# Patient Record
Sex: Male | Born: 1965 | Race: White | Hispanic: No | Marital: Married | Smoking: Never smoker
Health system: Southern US, Community
[De-identification: ages and names within clinical notes are randomized; demographics above are authoritative.]

## PROBLEM LIST (undated history)

## (undated) ENCOUNTER — Emergency Department (HOSPITAL_COMMUNITY): Source: Home / Self Care

## (undated) DIAGNOSIS — M549 Dorsalgia, unspecified: Secondary | ICD-10-CM

## (undated) DIAGNOSIS — I10 Essential (primary) hypertension: Secondary | ICD-10-CM

## (undated) HISTORY — PX: TONSILLECTOMY: SUR1361

## (undated) HISTORY — PX: BACK SURGERY: SHX140

---

## 1999-07-26 ENCOUNTER — Encounter: Payer: Self-pay | Admitting: Family Medicine

## 1999-07-26 ENCOUNTER — Ambulatory Visit (HOSPITAL_COMMUNITY): Admission: RE | Admit: 1999-07-26 | Discharge: 1999-07-26 | Payer: Self-pay | Admitting: Family Medicine

## 2003-11-30 ENCOUNTER — Encounter: Admission: RE | Admit: 2003-11-30 | Discharge: 2004-02-28 | Payer: Self-pay | Admitting: Neurosurgery

## 2004-05-29 ENCOUNTER — Emergency Department (HOSPITAL_COMMUNITY): Admission: EM | Admit: 2004-05-29 | Discharge: 2004-05-29 | Payer: Self-pay | Admitting: *Deleted

## 2007-04-02 ENCOUNTER — Encounter: Admission: RE | Admit: 2007-04-02 | Discharge: 2007-04-02 | Payer: Self-pay | Admitting: Specialist

## 2012-12-17 DIAGNOSIS — IMO0002 Reserved for concepts with insufficient information to code with codable children: Secondary | ICD-10-CM | POA: Diagnosis not present

## 2012-12-17 DIAGNOSIS — E669 Obesity, unspecified: Secondary | ICD-10-CM | POA: Diagnosis not present

## 2012-12-19 DIAGNOSIS — M5137 Other intervertebral disc degeneration, lumbosacral region: Secondary | ICD-10-CM | POA: Diagnosis not present

## 2012-12-19 DIAGNOSIS — M5126 Other intervertebral disc displacement, lumbar region: Secondary | ICD-10-CM | POA: Diagnosis not present

## 2013-04-03 DIAGNOSIS — Z23 Encounter for immunization: Secondary | ICD-10-CM | POA: Diagnosis not present

## 2013-04-03 DIAGNOSIS — IMO0002 Reserved for concepts with insufficient information to code with codable children: Secondary | ICD-10-CM | POA: Diagnosis not present

## 2013-04-03 DIAGNOSIS — Z1331 Encounter for screening for depression: Secondary | ICD-10-CM | POA: Diagnosis not present

## 2013-04-03 DIAGNOSIS — M545 Low back pain, unspecified: Secondary | ICD-10-CM | POA: Diagnosis not present

## 2013-04-03 DIAGNOSIS — K2100 Gastro-esophageal reflux disease with esophagitis, without bleeding: Secondary | ICD-10-CM | POA: Diagnosis not present

## 2013-04-03 DIAGNOSIS — E782 Mixed hyperlipidemia: Secondary | ICD-10-CM | POA: Diagnosis not present

## 2013-04-03 DIAGNOSIS — E669 Obesity, unspecified: Secondary | ICD-10-CM | POA: Diagnosis not present

## 2013-04-03 DIAGNOSIS — I10 Essential (primary) hypertension: Secondary | ICD-10-CM | POA: Diagnosis not present

## 2013-06-12 DIAGNOSIS — E782 Mixed hyperlipidemia: Secondary | ICD-10-CM | POA: Diagnosis not present

## 2013-06-12 DIAGNOSIS — I10 Essential (primary) hypertension: Secondary | ICD-10-CM | POA: Diagnosis not present

## 2013-06-12 DIAGNOSIS — E669 Obesity, unspecified: Secondary | ICD-10-CM | POA: Diagnosis not present

## 2013-06-12 DIAGNOSIS — IMO0002 Reserved for concepts with insufficient information to code with codable children: Secondary | ICD-10-CM | POA: Diagnosis not present

## 2013-06-12 DIAGNOSIS — E8881 Metabolic syndrome: Secondary | ICD-10-CM | POA: Diagnosis not present

## 2013-06-12 DIAGNOSIS — K2100 Gastro-esophageal reflux disease with esophagitis, without bleeding: Secondary | ICD-10-CM | POA: Diagnosis not present

## 2013-06-12 DIAGNOSIS — K4090 Unilateral inguinal hernia, without obstruction or gangrene, not specified as recurrent: Secondary | ICD-10-CM | POA: Diagnosis not present

## 2013-06-12 DIAGNOSIS — M545 Low back pain, unspecified: Secondary | ICD-10-CM | POA: Diagnosis not present

## 2013-06-25 DIAGNOSIS — K4030 Unilateral inguinal hernia, with obstruction, without gangrene, not specified as recurrent: Secondary | ICD-10-CM | POA: Diagnosis not present

## 2013-06-26 DIAGNOSIS — D176 Benign lipomatous neoplasm of spermatic cord: Secondary | ICD-10-CM | POA: Diagnosis not present

## 2013-06-26 DIAGNOSIS — I10 Essential (primary) hypertension: Secondary | ICD-10-CM | POA: Diagnosis not present

## 2013-06-26 DIAGNOSIS — F411 Generalized anxiety disorder: Secondary | ICD-10-CM | POA: Diagnosis not present

## 2013-06-26 DIAGNOSIS — K4090 Unilateral inguinal hernia, without obstruction or gangrene, not specified as recurrent: Secondary | ICD-10-CM | POA: Diagnosis not present

## 2013-06-26 DIAGNOSIS — E669 Obesity, unspecified: Secondary | ICD-10-CM | POA: Diagnosis not present

## 2013-06-26 DIAGNOSIS — M129 Arthropathy, unspecified: Secondary | ICD-10-CM | POA: Diagnosis not present

## 2013-06-26 DIAGNOSIS — Z6834 Body mass index (BMI) 34.0-34.9, adult: Secondary | ICD-10-CM | POA: Diagnosis not present

## 2013-06-29 DIAGNOSIS — D176 Benign lipomatous neoplasm of spermatic cord: Secondary | ICD-10-CM | POA: Diagnosis not present

## 2013-06-29 DIAGNOSIS — Z6834 Body mass index (BMI) 34.0-34.9, adult: Secondary | ICD-10-CM | POA: Diagnosis not present

## 2013-06-29 DIAGNOSIS — E669 Obesity, unspecified: Secondary | ICD-10-CM | POA: Diagnosis not present

## 2013-06-29 DIAGNOSIS — F411 Generalized anxiety disorder: Secondary | ICD-10-CM | POA: Diagnosis not present

## 2013-06-29 DIAGNOSIS — I10 Essential (primary) hypertension: Secondary | ICD-10-CM | POA: Diagnosis not present

## 2013-06-29 DIAGNOSIS — K4090 Unilateral inguinal hernia, without obstruction or gangrene, not specified as recurrent: Secondary | ICD-10-CM | POA: Diagnosis not present

## 2013-06-29 DIAGNOSIS — M129 Arthropathy, unspecified: Secondary | ICD-10-CM | POA: Diagnosis not present

## 2013-08-03 DIAGNOSIS — Z79899 Other long term (current) drug therapy: Secondary | ICD-10-CM | POA: Diagnosis not present

## 2013-08-03 DIAGNOSIS — Z5181 Encounter for therapeutic drug level monitoring: Secondary | ICD-10-CM | POA: Diagnosis not present

## 2013-08-03 DIAGNOSIS — K2100 Gastro-esophageal reflux disease with esophagitis, without bleeding: Secondary | ICD-10-CM | POA: Diagnosis not present

## 2013-08-03 DIAGNOSIS — Z1331 Encounter for screening for depression: Secondary | ICD-10-CM | POA: Diagnosis not present

## 2013-08-03 DIAGNOSIS — E782 Mixed hyperlipidemia: Secondary | ICD-10-CM | POA: Diagnosis not present

## 2013-08-03 DIAGNOSIS — I10 Essential (primary) hypertension: Secondary | ICD-10-CM | POA: Diagnosis not present

## 2013-08-03 DIAGNOSIS — E669 Obesity, unspecified: Secondary | ICD-10-CM | POA: Diagnosis not present

## 2013-08-03 DIAGNOSIS — E8881 Metabolic syndrome: Secondary | ICD-10-CM | POA: Diagnosis not present

## 2013-08-03 DIAGNOSIS — K4090 Unilateral inguinal hernia, without obstruction or gangrene, not specified as recurrent: Secondary | ICD-10-CM | POA: Diagnosis not present

## 2013-08-03 DIAGNOSIS — M545 Low back pain, unspecified: Secondary | ICD-10-CM | POA: Diagnosis not present

## 2013-08-03 DIAGNOSIS — IMO0002 Reserved for concepts with insufficient information to code with codable children: Secondary | ICD-10-CM | POA: Diagnosis not present

## 2013-10-30 DIAGNOSIS — I10 Essential (primary) hypertension: Secondary | ICD-10-CM | POA: Diagnosis not present

## 2013-10-30 DIAGNOSIS — G8929 Other chronic pain: Secondary | ICD-10-CM | POA: Diagnosis not present

## 2013-10-30 DIAGNOSIS — F3289 Other specified depressive episodes: Secondary | ICD-10-CM | POA: Diagnosis not present

## 2013-10-30 DIAGNOSIS — E785 Hyperlipidemia, unspecified: Secondary | ICD-10-CM | POA: Diagnosis not present

## 2013-11-25 DIAGNOSIS — Z6830 Body mass index (BMI) 30.0-30.9, adult: Secondary | ICD-10-CM | POA: Diagnosis not present

## 2013-11-25 DIAGNOSIS — I10 Essential (primary) hypertension: Secondary | ICD-10-CM | POA: Diagnosis not present

## 2013-11-25 DIAGNOSIS — M545 Low back pain, unspecified: Secondary | ICD-10-CM | POA: Diagnosis not present

## 2013-11-25 DIAGNOSIS — IMO0002 Reserved for concepts with insufficient information to code with codable children: Secondary | ICD-10-CM | POA: Diagnosis not present

## 2014-03-08 DIAGNOSIS — R079 Chest pain, unspecified: Secondary | ICD-10-CM | POA: Diagnosis not present

## 2014-03-08 DIAGNOSIS — R0789 Other chest pain: Secondary | ICD-10-CM | POA: Diagnosis not present

## 2014-03-09 DIAGNOSIS — I517 Cardiomegaly: Secondary | ICD-10-CM | POA: Diagnosis not present

## 2014-03-09 DIAGNOSIS — R079 Chest pain, unspecified: Secondary | ICD-10-CM | POA: Diagnosis not present

## 2014-03-23 DIAGNOSIS — I132 Hypertensive heart and chronic kidney disease with heart failure and with stage 5 chronic kidney disease, or end stage renal disease: Secondary | ICD-10-CM | POA: Diagnosis not present

## 2014-03-23 DIAGNOSIS — N19 Unspecified kidney failure: Secondary | ICD-10-CM | POA: Diagnosis not present

## 2014-03-23 DIAGNOSIS — R079 Chest pain, unspecified: Secondary | ICD-10-CM | POA: Diagnosis not present

## 2014-03-23 DIAGNOSIS — I509 Heart failure, unspecified: Secondary | ICD-10-CM | POA: Diagnosis not present

## 2014-11-29 DIAGNOSIS — Z79899 Other long term (current) drug therapy: Secondary | ICD-10-CM | POA: Diagnosis not present

## 2014-11-29 DIAGNOSIS — R51 Headache: Secondary | ICD-10-CM | POA: Diagnosis not present

## 2014-11-29 DIAGNOSIS — S300XXA Contusion of lower back and pelvis, initial encounter: Secondary | ICD-10-CM | POA: Diagnosis not present

## 2014-11-29 DIAGNOSIS — M5136 Other intervertebral disc degeneration, lumbar region: Secondary | ICD-10-CM | POA: Diagnosis not present

## 2014-11-29 DIAGNOSIS — I10 Essential (primary) hypertension: Secondary | ICD-10-CM | POA: Diagnosis not present

## 2014-11-29 DIAGNOSIS — M542 Cervicalgia: Secondary | ICD-10-CM | POA: Diagnosis not present

## 2014-11-29 DIAGNOSIS — S39012A Strain of muscle, fascia and tendon of lower back, initial encounter: Secondary | ICD-10-CM | POA: Diagnosis not present

## 2014-11-29 DIAGNOSIS — W28XXXA Contact with powered lawn mower, initial encounter: Secondary | ICD-10-CM | POA: Diagnosis not present

## 2014-11-29 DIAGNOSIS — Z7982 Long term (current) use of aspirin: Secondary | ICD-10-CM | POA: Diagnosis not present

## 2014-11-29 DIAGNOSIS — S1083XA Contusion of other specified part of neck, initial encounter: Secondary | ICD-10-CM | POA: Diagnosis not present

## 2014-11-29 DIAGNOSIS — Z8249 Family history of ischemic heart disease and other diseases of the circulatory system: Secondary | ICD-10-CM | POA: Diagnosis not present

## 2015-01-19 DIAGNOSIS — I10 Essential (primary) hypertension: Secondary | ICD-10-CM | POA: Diagnosis not present

## 2015-01-19 DIAGNOSIS — I429 Cardiomyopathy, unspecified: Secondary | ICD-10-CM | POA: Diagnosis not present

## 2015-01-19 DIAGNOSIS — E785 Hyperlipidemia, unspecified: Secondary | ICD-10-CM | POA: Diagnosis not present

## 2016-05-31 DIAGNOSIS — L739 Follicular disorder, unspecified: Secondary | ICD-10-CM | POA: Diagnosis not present

## 2016-05-31 DIAGNOSIS — M5136 Other intervertebral disc degeneration, lumbar region: Secondary | ICD-10-CM | POA: Diagnosis not present

## 2016-05-31 DIAGNOSIS — I10 Essential (primary) hypertension: Secondary | ICD-10-CM | POA: Diagnosis not present

## 2016-05-31 DIAGNOSIS — F418 Other specified anxiety disorders: Secondary | ICD-10-CM | POA: Diagnosis not present

## 2016-05-31 DIAGNOSIS — F5102 Adjustment insomnia: Secondary | ICD-10-CM | POA: Diagnosis not present

## 2016-05-31 DIAGNOSIS — Z6827 Body mass index (BMI) 27.0-27.9, adult: Secondary | ICD-10-CM | POA: Diagnosis not present

## 2016-08-16 DIAGNOSIS — M5136 Other intervertebral disc degeneration, lumbar region: Secondary | ICD-10-CM | POA: Diagnosis not present

## 2016-08-16 DIAGNOSIS — Z6826 Body mass index (BMI) 26.0-26.9, adult: Secondary | ICD-10-CM | POA: Diagnosis not present

## 2016-08-16 DIAGNOSIS — F5102 Adjustment insomnia: Secondary | ICD-10-CM | POA: Diagnosis not present

## 2016-08-16 DIAGNOSIS — F418 Other specified anxiety disorders: Secondary | ICD-10-CM | POA: Diagnosis not present

## 2016-08-16 DIAGNOSIS — L739 Follicular disorder, unspecified: Secondary | ICD-10-CM | POA: Diagnosis not present

## 2016-08-16 DIAGNOSIS — I10 Essential (primary) hypertension: Secondary | ICD-10-CM | POA: Diagnosis not present

## 2016-11-07 DIAGNOSIS — S93491A Sprain of other ligament of right ankle, initial encounter: Secondary | ICD-10-CM | POA: Diagnosis not present

## 2016-11-07 DIAGNOSIS — F5102 Adjustment insomnia: Secondary | ICD-10-CM | POA: Diagnosis not present

## 2016-11-07 DIAGNOSIS — I10 Essential (primary) hypertension: Secondary | ICD-10-CM | POA: Diagnosis not present

## 2016-11-07 DIAGNOSIS — M5136 Other intervertebral disc degeneration, lumbar region: Secondary | ICD-10-CM | POA: Diagnosis not present

## 2016-11-07 DIAGNOSIS — L739 Follicular disorder, unspecified: Secondary | ICD-10-CM | POA: Diagnosis not present

## 2016-11-07 DIAGNOSIS — Z6826 Body mass index (BMI) 26.0-26.9, adult: Secondary | ICD-10-CM | POA: Diagnosis not present

## 2016-11-07 DIAGNOSIS — F418 Other specified anxiety disorders: Secondary | ICD-10-CM | POA: Diagnosis not present

## 2016-11-07 DIAGNOSIS — Z6827 Body mass index (BMI) 27.0-27.9, adult: Secondary | ICD-10-CM | POA: Diagnosis not present

## 2016-12-13 DIAGNOSIS — R1031 Right lower quadrant pain: Secondary | ICD-10-CM | POA: Diagnosis not present

## 2016-12-17 DIAGNOSIS — K4090 Unilateral inguinal hernia, without obstruction or gangrene, not specified as recurrent: Secondary | ICD-10-CM | POA: Diagnosis not present

## 2016-12-17 DIAGNOSIS — I10 Essential (primary) hypertension: Secondary | ICD-10-CM | POA: Diagnosis not present

## 2016-12-17 DIAGNOSIS — Z7982 Long term (current) use of aspirin: Secondary | ICD-10-CM | POA: Diagnosis not present

## 2016-12-17 DIAGNOSIS — F419 Anxiety disorder, unspecified: Secondary | ICD-10-CM | POA: Diagnosis not present

## 2016-12-17 DIAGNOSIS — Z79899 Other long term (current) drug therapy: Secondary | ICD-10-CM | POA: Diagnosis not present

## 2016-12-18 DIAGNOSIS — K4090 Unilateral inguinal hernia, without obstruction or gangrene, not specified as recurrent: Secondary | ICD-10-CM | POA: Diagnosis not present

## 2016-12-21 DIAGNOSIS — F329 Major depressive disorder, single episode, unspecified: Secondary | ICD-10-CM | POA: Diagnosis not present

## 2016-12-21 DIAGNOSIS — Z880 Allergy status to penicillin: Secondary | ICD-10-CM | POA: Diagnosis not present

## 2016-12-21 DIAGNOSIS — Z7982 Long term (current) use of aspirin: Secondary | ICD-10-CM | POA: Diagnosis not present

## 2016-12-21 DIAGNOSIS — Z886 Allergy status to analgesic agent status: Secondary | ICD-10-CM | POA: Diagnosis not present

## 2016-12-21 DIAGNOSIS — K219 Gastro-esophageal reflux disease without esophagitis: Secondary | ICD-10-CM | POA: Diagnosis not present

## 2016-12-21 DIAGNOSIS — K4090 Unilateral inguinal hernia, without obstruction or gangrene, not specified as recurrent: Secondary | ICD-10-CM | POA: Diagnosis not present

## 2016-12-21 DIAGNOSIS — Z8249 Family history of ischemic heart disease and other diseases of the circulatory system: Secondary | ICD-10-CM | POA: Diagnosis not present

## 2016-12-21 DIAGNOSIS — I110 Hypertensive heart disease with heart failure: Secondary | ICD-10-CM | POA: Diagnosis not present

## 2016-12-21 DIAGNOSIS — Z79899 Other long term (current) drug therapy: Secondary | ICD-10-CM | POA: Diagnosis not present

## 2016-12-21 DIAGNOSIS — M1990 Unspecified osteoarthritis, unspecified site: Secondary | ICD-10-CM | POA: Diagnosis not present

## 2016-12-21 DIAGNOSIS — I509 Heart failure, unspecified: Secondary | ICD-10-CM | POA: Diagnosis not present

## 2016-12-21 DIAGNOSIS — F419 Anxiety disorder, unspecified: Secondary | ICD-10-CM | POA: Diagnosis not present

## 2016-12-21 DIAGNOSIS — D176 Benign lipomatous neoplasm of spermatic cord: Secondary | ICD-10-CM | POA: Diagnosis not present

## 2016-12-24 DIAGNOSIS — K4090 Unilateral inguinal hernia, without obstruction or gangrene, not specified as recurrent: Secondary | ICD-10-CM | POA: Diagnosis not present

## 2016-12-24 DIAGNOSIS — I110 Hypertensive heart disease with heart failure: Secondary | ICD-10-CM | POA: Diagnosis not present

## 2016-12-24 DIAGNOSIS — Z79899 Other long term (current) drug therapy: Secondary | ICD-10-CM | POA: Diagnosis not present

## 2016-12-24 DIAGNOSIS — Z7982 Long term (current) use of aspirin: Secondary | ICD-10-CM | POA: Diagnosis not present

## 2016-12-24 DIAGNOSIS — D176 Benign lipomatous neoplasm of spermatic cord: Secondary | ICD-10-CM | POA: Diagnosis not present

## 2016-12-24 DIAGNOSIS — I509 Heart failure, unspecified: Secondary | ICD-10-CM | POA: Diagnosis not present

## 2017-02-13 DIAGNOSIS — I10 Essential (primary) hypertension: Secondary | ICD-10-CM | POA: Diagnosis not present

## 2017-02-13 DIAGNOSIS — F418 Other specified anxiety disorders: Secondary | ICD-10-CM | POA: Diagnosis not present

## 2017-02-13 DIAGNOSIS — M5136 Other intervertebral disc degeneration, lumbar region: Secondary | ICD-10-CM | POA: Diagnosis not present

## 2017-02-13 DIAGNOSIS — Z6827 Body mass index (BMI) 27.0-27.9, adult: Secondary | ICD-10-CM | POA: Diagnosis not present

## 2017-05-01 DIAGNOSIS — L739 Follicular disorder, unspecified: Secondary | ICD-10-CM | POA: Diagnosis not present

## 2017-05-01 DIAGNOSIS — M5136 Other intervertebral disc degeneration, lumbar region: Secondary | ICD-10-CM | POA: Diagnosis not present

## 2017-05-01 DIAGNOSIS — F5102 Adjustment insomnia: Secondary | ICD-10-CM | POA: Diagnosis not present

## 2017-05-01 DIAGNOSIS — F418 Other specified anxiety disorders: Secondary | ICD-10-CM | POA: Diagnosis not present

## 2017-05-01 DIAGNOSIS — I10 Essential (primary) hypertension: Secondary | ICD-10-CM | POA: Diagnosis not present

## 2017-05-01 DIAGNOSIS — Z6827 Body mass index (BMI) 27.0-27.9, adult: Secondary | ICD-10-CM | POA: Diagnosis not present

## 2017-07-29 DIAGNOSIS — I10 Essential (primary) hypertension: Secondary | ICD-10-CM | POA: Diagnosis not present

## 2017-07-29 DIAGNOSIS — G894 Chronic pain syndrome: Secondary | ICD-10-CM | POA: Diagnosis not present

## 2017-07-29 DIAGNOSIS — M5136 Other intervertebral disc degeneration, lumbar region: Secondary | ICD-10-CM | POA: Diagnosis not present

## 2017-07-29 DIAGNOSIS — L739 Follicular disorder, unspecified: Secondary | ICD-10-CM | POA: Diagnosis not present

## 2017-07-29 DIAGNOSIS — F418 Other specified anxiety disorders: Secondary | ICD-10-CM | POA: Diagnosis not present

## 2017-07-29 DIAGNOSIS — Z6827 Body mass index (BMI) 27.0-27.9, adult: Secondary | ICD-10-CM | POA: Diagnosis not present

## 2017-07-29 DIAGNOSIS — F5102 Adjustment insomnia: Secondary | ICD-10-CM | POA: Diagnosis not present

## 2017-11-23 ENCOUNTER — Other Ambulatory Visit: Payer: Self-pay

## 2017-11-23 ENCOUNTER — Encounter (HOSPITAL_COMMUNITY): Payer: Self-pay | Admitting: Emergency Medicine

## 2017-11-23 ENCOUNTER — Emergency Department (HOSPITAL_COMMUNITY)

## 2017-11-23 ENCOUNTER — Emergency Department (HOSPITAL_COMMUNITY)
Admission: EM | Admit: 2017-11-23 | Discharge: 2017-11-23 | Attending: Emergency Medicine | Admitting: Emergency Medicine

## 2017-11-23 DIAGNOSIS — S0282XA Fracture of other specified skull and facial bones, left side, initial encounter for closed fracture: Secondary | ICD-10-CM | POA: Diagnosis not present

## 2017-11-23 DIAGNOSIS — S02401A Maxillary fracture, unspecified, initial encounter for closed fracture: Secondary | ICD-10-CM | POA: Diagnosis not present

## 2017-11-23 DIAGNOSIS — I10 Essential (primary) hypertension: Secondary | ICD-10-CM

## 2017-11-23 DIAGNOSIS — Y929 Unspecified place or not applicable: Secondary | ICD-10-CM | POA: Diagnosis not present

## 2017-11-23 DIAGNOSIS — Y999 Unspecified external cause status: Secondary | ICD-10-CM | POA: Insufficient documentation

## 2017-11-23 DIAGNOSIS — S0285XA Fracture of orbit, unspecified, initial encounter for closed fracture: Secondary | ICD-10-CM

## 2017-11-23 DIAGNOSIS — S0240EA Zygomatic fracture, right side, initial encounter for closed fracture: Secondary | ICD-10-CM | POA: Diagnosis not present

## 2017-11-23 DIAGNOSIS — S0990XA Unspecified injury of head, initial encounter: Secondary | ICD-10-CM | POA: Diagnosis present

## 2017-11-23 DIAGNOSIS — Y939 Activity, unspecified: Secondary | ICD-10-CM | POA: Diagnosis not present

## 2017-11-23 DIAGNOSIS — Z79899 Other long term (current) drug therapy: Secondary | ICD-10-CM | POA: Diagnosis not present

## 2017-11-23 HISTORY — DX: Dorsalgia, unspecified: M54.9

## 2017-11-23 HISTORY — DX: Essential (primary) hypertension: I10

## 2017-11-23 MED ORDER — LISINOPRIL 10 MG PO TABS
20.0000 mg | ORAL_TABLET | Freq: Once | ORAL | Status: AC
  Administered 2017-11-23: 20 mg via ORAL
  Filled 2017-11-23: qty 2

## 2017-11-23 MED ORDER — IBUPROFEN 400 MG PO TABS
600.0000 mg | ORAL_TABLET | Freq: Once | ORAL | Status: AC
  Administered 2017-11-23: 600 mg via ORAL
  Filled 2017-11-23: qty 2

## 2017-11-23 MED ORDER — HYDROCHLOROTHIAZIDE 25 MG PO TABS
25.0000 mg | ORAL_TABLET | Freq: Every day | ORAL | Status: DC
  Administered 2017-11-23: 25 mg via ORAL
  Filled 2017-11-23: qty 1

## 2017-11-23 MED ORDER — LISINOPRIL-HYDROCHLOROTHIAZIDE 20-12.5 MG PO TABS: 1.0000 | ORAL_TABLET | Freq: Every day | ORAL | 1 refills | Status: AC

## 2017-11-23 MED ORDER — ACETAMINOPHEN 500 MG PO TABS
1000.0000 mg | ORAL_TABLET | Freq: Once | ORAL | Status: AC
  Administered 2017-11-23: 1000 mg via ORAL
  Filled 2017-11-23: qty 2

## 2017-11-23 MED ORDER — CLINDAMYCIN HCL 150 MG PO CAPS
300.0000 mg | ORAL_CAPSULE | Freq: Once | ORAL | Status: AC
  Administered 2017-11-23: 300 mg via ORAL
  Filled 2017-11-23: qty 2
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
## 2017-11-23 NOTE — ED Notes (Signed)
BP taken in both arms, pt states he takes BP meds daily, pt states before he was incarcerated he took 2 BP meds in the morning and 1 BP med 3 times daily but since incarceration they only give him the 2 BP meds at night bc they help him sleep
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
## 2017-11-29 DIAGNOSIS — S0280XA Fracture of other specified skull and facial bones, unspecified side, initial encounter for closed fracture: Secondary | ICD-10-CM | POA: Diagnosis not present

## 2017-12-23 DIAGNOSIS — F419 Anxiety disorder, unspecified: Secondary | ICD-10-CM | POA: Diagnosis not present

## 2017-12-23 DIAGNOSIS — I10 Essential (primary) hypertension: Secondary | ICD-10-CM | POA: Diagnosis not present

## 2017-12-23 DIAGNOSIS — Z6829 Body mass index (BMI) 29.0-29.9, adult: Secondary | ICD-10-CM | POA: Diagnosis not present

## 2017-12-23 DIAGNOSIS — Z23 Encounter for immunization: Secondary | ICD-10-CM | POA: Diagnosis not present

## 2017-12-23 DIAGNOSIS — M549 Dorsalgia, unspecified: Secondary | ICD-10-CM | POA: Diagnosis not present

## 2017-12-23 DIAGNOSIS — Z299 Encounter for prophylactic measures, unspecified: Secondary | ICD-10-CM | POA: Diagnosis not present

## 2017-12-23 DIAGNOSIS — Z789 Other specified health status: Secondary | ICD-10-CM | POA: Diagnosis not present

## 2018-01-24 DIAGNOSIS — Z6831 Body mass index (BMI) 31.0-31.9, adult: Secondary | ICD-10-CM | POA: Diagnosis not present

## 2018-01-24 DIAGNOSIS — F419 Anxiety disorder, unspecified: Secondary | ICD-10-CM | POA: Diagnosis not present

## 2018-01-24 DIAGNOSIS — Z299 Encounter for prophylactic measures, unspecified: Secondary | ICD-10-CM | POA: Diagnosis not present

## 2018-01-24 DIAGNOSIS — M549 Dorsalgia, unspecified: Secondary | ICD-10-CM | POA: Diagnosis not present

## 2018-01-24 DIAGNOSIS — I10 Essential (primary) hypertension: Secondary | ICD-10-CM | POA: Diagnosis not present

## 2018-02-17 DIAGNOSIS — M130 Polyarthritis, unspecified: Secondary | ICD-10-CM | POA: Diagnosis not present

## 2018-02-17 DIAGNOSIS — M545 Low back pain: Secondary | ICD-10-CM | POA: Diagnosis not present

## 2018-02-17 DIAGNOSIS — G4701 Insomnia due to medical condition: Secondary | ICD-10-CM | POA: Diagnosis not present

## 2018-02-17 DIAGNOSIS — G894 Chronic pain syndrome: Secondary | ICD-10-CM | POA: Diagnosis not present

## 2018-02-17 DIAGNOSIS — Z79891 Long term (current) use of opiate analgesic: Secondary | ICD-10-CM | POA: Diagnosis not present

## 2018-02-17 DIAGNOSIS — M542 Cervicalgia: Secondary | ICD-10-CM | POA: Diagnosis not present

## 2018-04-08 ENCOUNTER — Ambulatory Visit (HOSPITAL_COMMUNITY)
Admission: RE | Admit: 2018-04-08 | Discharge: 2018-04-08 | Disposition: A | Discharge status: Home / Self Care | Payer: Medicare Other | Source: Ambulatory Visit | Attending: Neurology | Admitting: Neurology

## 2018-04-08 ENCOUNTER — Other Ambulatory Visit (HOSPITAL_COMMUNITY): Payer: Self-pay | Admitting: Neurology

## 2018-04-08 DIAGNOSIS — M545 Low back pain, unspecified: Secondary | ICD-10-CM

## 2018-08-19 DIAGNOSIS — M130 Polyarthritis, unspecified: Secondary | ICD-10-CM | POA: Diagnosis not present

## 2018-08-19 DIAGNOSIS — M542 Cervicalgia: Secondary | ICD-10-CM | POA: Diagnosis not present

## 2018-08-19 DIAGNOSIS — M545 Low back pain: Secondary | ICD-10-CM | POA: Diagnosis not present

## 2018-08-19 DIAGNOSIS — G4701 Insomnia due to medical condition: Secondary | ICD-10-CM | POA: Diagnosis not present

## 2019-01-21 DIAGNOSIS — I10 Essential (primary) hypertension: Secondary | ICD-10-CM | POA: Diagnosis not present

## 2019-01-21 DIAGNOSIS — R569 Unspecified convulsions: Secondary | ICD-10-CM | POA: Diagnosis not present

## 2019-01-21 DIAGNOSIS — R0902 Hypoxemia: Secondary | ICD-10-CM | POA: Diagnosis not present

## 2019-01-21 DIAGNOSIS — R000 Tachycardia, unspecified: Secondary | ICD-10-CM | POA: Diagnosis not present

## 2019-01-22 DIAGNOSIS — R569 Unspecified convulsions: Secondary | ICD-10-CM | POA: Diagnosis not present
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
## 2019-06-07 ENCOUNTER — Emergency Department (HOSPITAL_COMMUNITY)
Admission: EM | Admit: 2019-06-07 | Discharge: 2019-06-18 | Disposition: E | Payer: Medicare Other | Attending: Emergency Medicine | Admitting: Emergency Medicine

## 2019-06-07 DIAGNOSIS — R404 Transient alteration of awareness: Secondary | ICD-10-CM | POA: Diagnosis not present

## 2019-06-07 DIAGNOSIS — I213 ST elevation (STEMI) myocardial infarction of unspecified site: Secondary | ICD-10-CM | POA: Diagnosis not present

## 2019-06-07 DIAGNOSIS — I469 Cardiac arrest, cause unspecified: Secondary | ICD-10-CM | POA: Diagnosis not present

## 2019-06-07 DIAGNOSIS — R519 Headache, unspecified: Secondary | ICD-10-CM | POA: Insufficient documentation

## 2019-06-07 DIAGNOSIS — I499 Cardiac arrhythmia, unspecified: Secondary | ICD-10-CM | POA: Diagnosis not present

## 2019-06-07 DIAGNOSIS — I4901 Ventricular fibrillation: Secondary | ICD-10-CM | POA: Diagnosis not present

## 2019-06-07 DIAGNOSIS — I471 Supraventricular tachycardia: Secondary | ICD-10-CM | POA: Diagnosis not present

## 2019-06-07 LAB — CBG MONITORING, ED: Glucose-Capillary: 188 mg/dL — ABNORMAL HIGH (ref 70–99)

## 2019-06-07 MED ORDER — SODIUM BICARBONATE 8.4 % IV SOLN
INTRAVENOUS | Status: AC | PRN
  Administered 2019-06-07 (×2): 50 meq via INTRAVENOUS

## 2019-06-07 MED ORDER — EPINEPHRINE 1 MG/10ML IJ SOSY
PREFILLED_SYRINGE | INTRAMUSCULAR | Status: AC | PRN
  Administered 2019-06-07 – 2019-06-08 (×4): 1 via INTRAVENOUS
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
## 2019-06-07 NOTE — ED Notes (Signed)
Patient spouse calling [facility] [phone] asking when we can to please call back to give an update on patient

## 2019-06-08 ENCOUNTER — Other Ambulatory Visit: Payer: Self-pay

## 2019-06-08 MED FILL — Medication: Qty: 1 | Status: AC

## 2019-06-18 NOTE — ED Provider Notes (Addendum)
[facility] EMERGENCY DEPARTMENT Provider Note   CSN: [id number] Arrival date & time: 06/06/2019  2343     History Chief Complaint  Patient presents with  . Cardiac Arrest    Ethan Rodriguez is a 54 y.o. male.  Patient brought to the emergency department by EMS from home.  EMS report that they were originally called out for possible seizure.  Upon their arrival to the home patient was unresponsive with agonal respirations.  They attempted intubation which was unsuccessful, placed a King's airway.  EMS report initial rhythm of pulseless SVT, they initiated CPR.  Patient given a total of 6 epi doses.  They were able to identify V. fib and V. tach during pulse checks, was shocked 3 times.  They report return of spontaneous circulation after 10 minutes of ACLS, but lost pulses prior to arrival in the ER.        No past medical history on file.  There are no problems to display for this patient.        No family history on file.  Social History   Tobacco Use  . Smoking status: Not on file  Substance Use Topics  . Alcohol use: Not on file  . Drug use: Not on file    Home Medications Prior to Admission medications   Not on File    Allergies    Patient has no allergy information on record.  Review of Systems   Review of Systems  Unable to perform ROS: Acuity of condition    Physical Exam Updated Vital Signs BP (!) 0/0   Pulse (!) 0   Temp (!) 94.9 F (34.9 C) (Temporal)   Resp (!) 0   Ht 5\' 7"  (1.702 m)   Wt 124.7 kg   SpO2 (!) 73%   BMI 43.07 kg/m   Physical Exam Vitals and nursing note reviewed.  Constitutional:      Appearance: He is obese.  HENT:     Head:     Comments: Blood noted in mouth and on face Eyes:     Comments: Fixed and dilated  Cardiovascular:     Comments: No cardiac activity auscultated Pulmonary:     Comments: Coarse breath sounds bilaterally with bagging via King's airway Abdominal:     General: There is  distension.  Musculoskeletal:        General: No deformity.     Cervical back: Neck supple.  Skin:    Comments: Cool  Neurological:     Comments: Unresponsive     ED Results / Procedures / Treatments   Labs (all labs ordered are listed, but only abnormal results are displayed) Labs Reviewed  CBG MONITORING, ED - Abnormal; Notable for the following components:      Result Value   Glucose-Capillary 188 (*)    All other components within normal limits    EKG None  ED ECG REPORT   Date: 06-24-19  Rate: 18  Rhythm: uncertain rhythm - wide complex bradycardia  QRS Axis: left   ST/T Wave abnormalities: nonspecific ST changes  Conduction Disutrbances:nonspecific intraventricular conduction delay  Narrative Interpretation:   Old EKG Reviewed: none available  I have personally reviewed the EKG tracing and agree with the computerized printout as noted.   Radiology No results found.  Procedures Procedure Name: Intubation Date/Time: 05/23/2019 11:50 PM Performed by: Orpah Greek, MD Pre-anesthesia Checklist: Patient identified, Suction available, Timeout performed and Patient being monitored Oxygen Delivery Method: Ambu bag Preoxygenation: Pre-oxygenation  with 100% oxygen Ventilation: Mask ventilation without difficulty Laryngoscope Size: Glidescope and 4 Grade View: Grade I Tube size: 7.5 mm Number of attempts: 1 Placement Confirmation: ETT inserted through vocal cords under direct vision,  Breath sounds checked- equal and bilateral and CO2 detector Secured at: 25 cm Tube secured with: ETT holder Dental Injury: Teeth and Oropharynx as per pre-operative assessment     CPR  Date/Time: 2019/06/11 12:39 AM Performed by: Orpah Greek, MD Authorized by: Orpah Greek, MD  CPR Procedure Details:    ACLS/BLS initiated by EMS: Yes     CPR/ACLS performed in the ED: Yes     Duration of CPR (minutes):  20   Outcome: Pt declared dead    CPR  performed via ACLS guidelines under my direct supervision.  See RN documentation for details including defibrillator use, medications, doses and timing. .Critical Care Performed by: Orpah Greek, MD Authorized by: Orpah Greek, MD   Critical care provider statement:    Critical care time (minutes):  30   Critical care time was exclusive of:  Separately billable procedures and treating other patients   Critical care was necessary to treat or prevent imminent or life-threatening deterioration of the following conditions:  Respiratory failure, cardiac failure, circulatory failure and CNS failure or compromise   Critical care was time spent personally by me on the following activities:  Ordering and performing treatments and interventions, pulse oximetry, evaluation of patient's response to treatment, re-evaluation of patient's condition, review of old charts, examination of patient and obtaining history from patient or surrogate   I assumed direction of critical care for this patient from another provider in my specialty: no     (including critical care time)  Medications Ordered in ED Medications  EPINEPHrine (ADRENALIN) 1 MG/10ML injection (1 Syringe Intravenous Canceled Entry 06/11/2019 0008)  sodium bicarbonate injection (50 mEq Intravenous Given 05/30/2019 2349)    ED Course  I have reviewed the triage vital signs and the nursing notes.  Pertinent labs & imaging results that were available during my care of the patient were reviewed by me and considered in my medical decision making (see chart for details).    MDM Rules/Calculators/A&P                      Patient brought to the emergency department with CPR in progress.  Not much is known about the patient.  EMS was told by family that he potentially abuses "sleeping pills".  Wife reported that he suddenly became unresponsive and was shaking all over, like he was having a seizure.  Patient with a total of 15 to 20  minutes of CPR during transport.  He was pulseless at arrival to the ER.  King's airway was removed and endotracheal tube was placed.  ACLS was continued.  Patient administered epi doses every 3 minutes.  He was given 2 A of bicarb at arrival because of prolonged downtime.  Pulse was largely PEA throughout, although he did briefly get faint carotid pulses after each epi dose that lasted approximately 30 seconds and then disappeared.  This was felt to be purely epi effect.  He did not have any improvement with an epi drip.  Patient declared dead at 12:05 AM.  I did inform his wife, Dimetri Druckman by phone.  She reports that he was doing well today until he started to complain of a headache tonight and then collapsed.  Medical examiner, Izora Ribas was also consulted,  he will review the case.  Final Clinical Impression(s) / ED Diagnoses Final diagnoses:  Cardiopulmonary arrest [facility])    Rx / DC Orders ED Discharge Orders    None       Basir Niven, Gwenyth Allegra, MD June 25, 2019 [id number]    Orpah Greek, MD 06/25/19 [phone]

## 2019-06-18 DEATH — deceased

## 2019-10-17 IMAGING — DX DG LUMBAR SPINE 2-3V
3 series · 3 of 3 positions shown · non-contrast
Comparison: 11/29/2014

CLINICAL DATA: Chronic low back pain from work related injury in
8444, back pain into both legs, history back surgery and arthritis

EXAM:
LUMBAR SPINE - 2-3 VIEW

[l-spine ap]
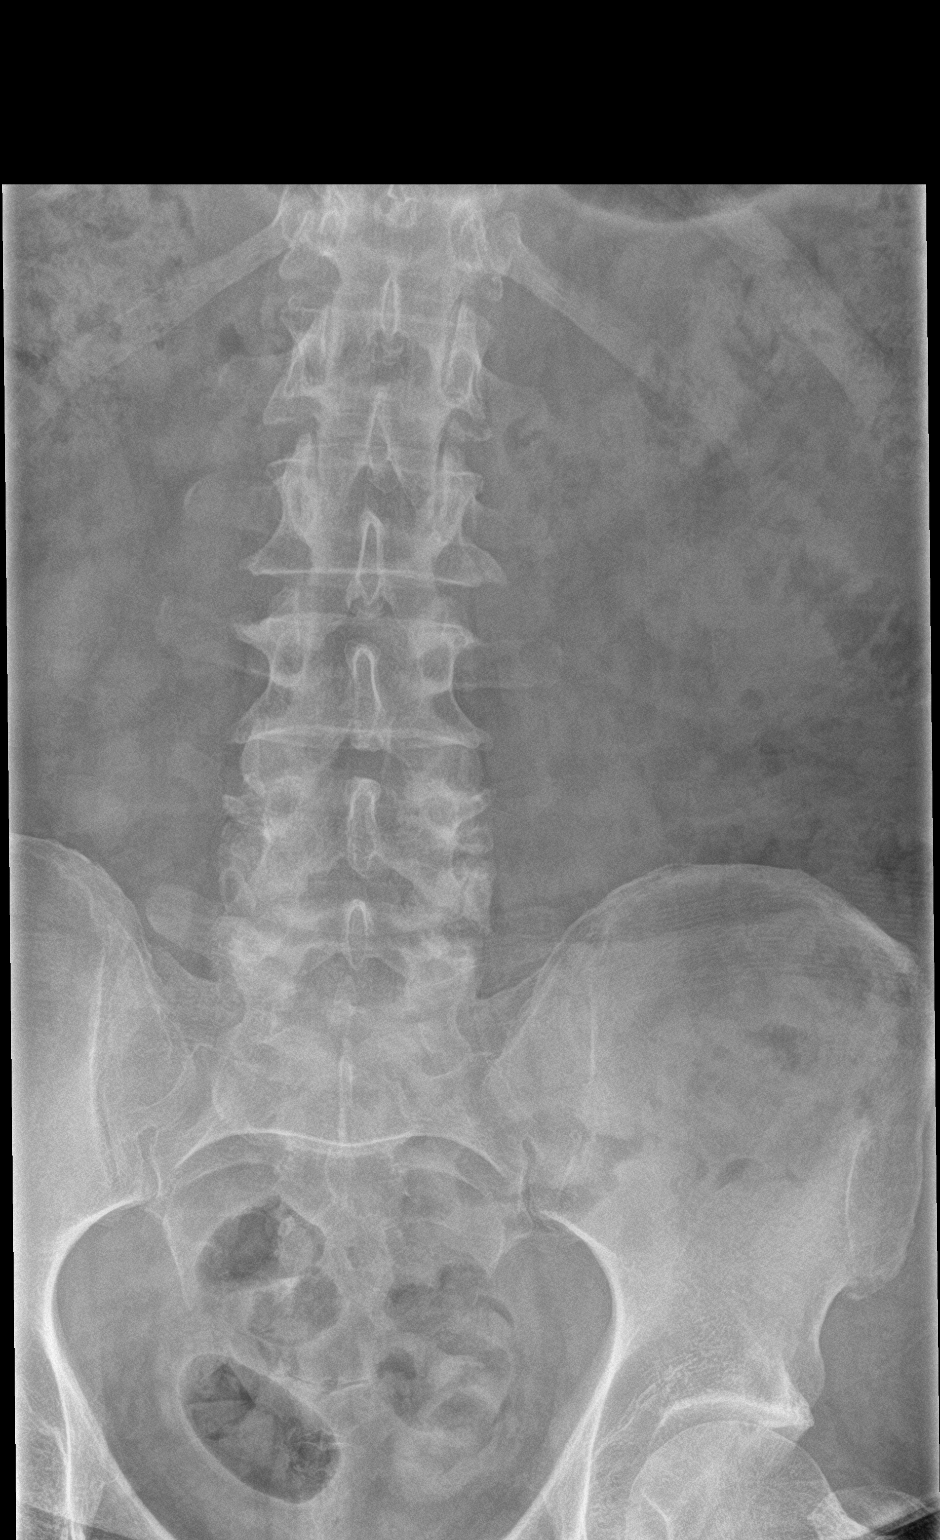

[l-spine lat]
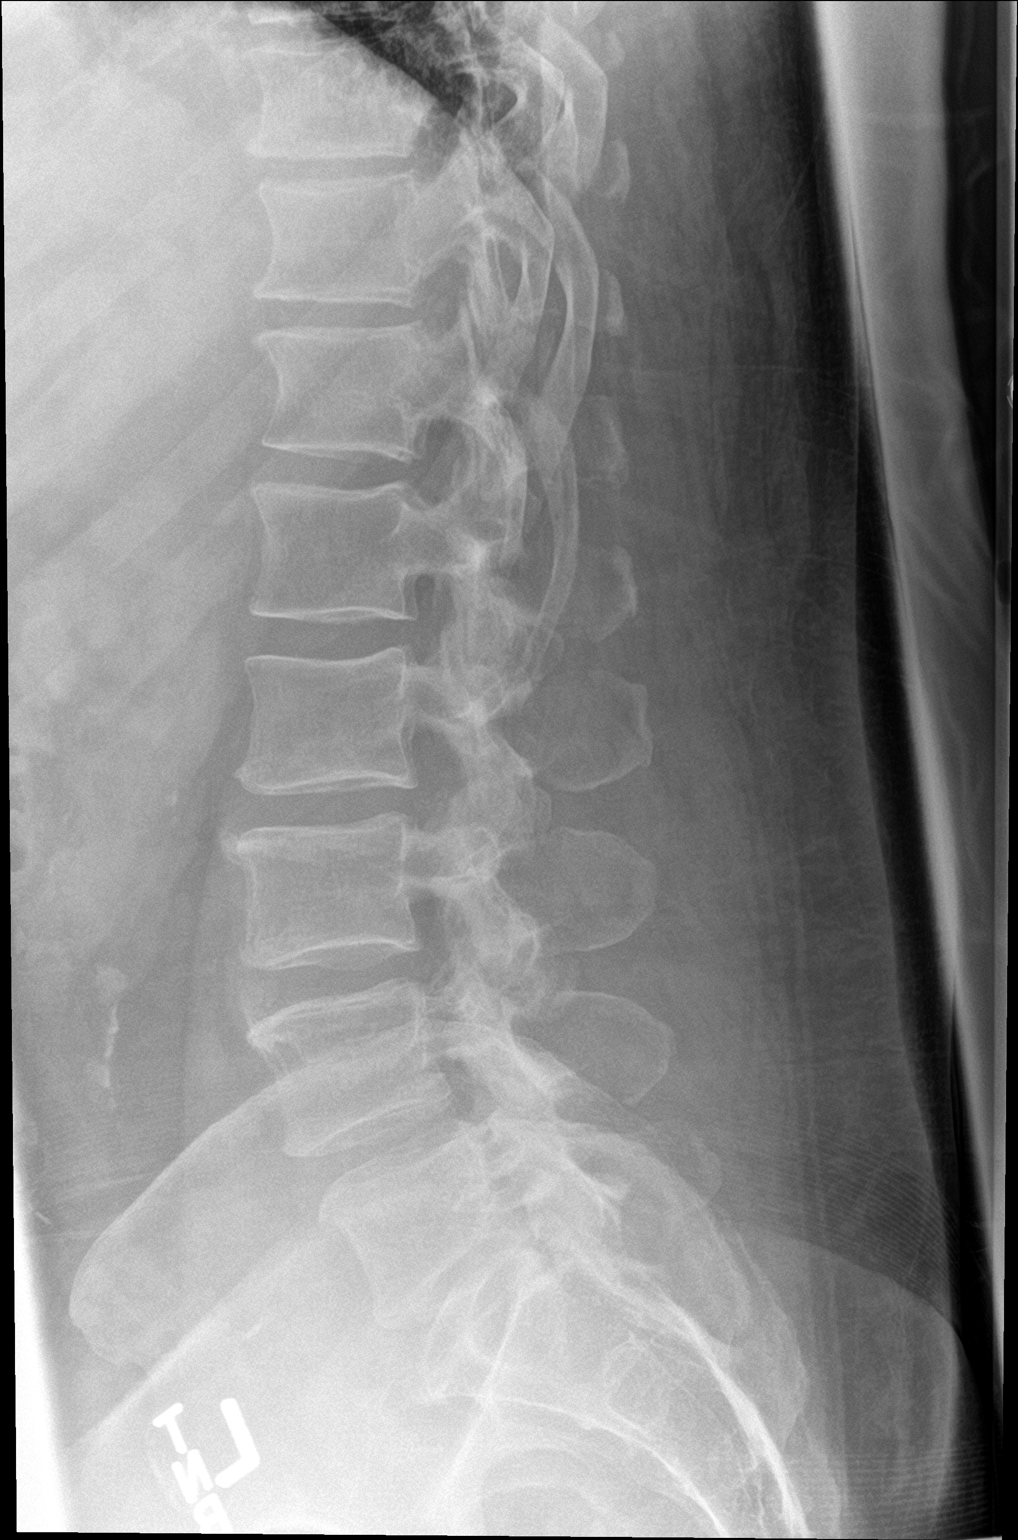

[l-spine spot]
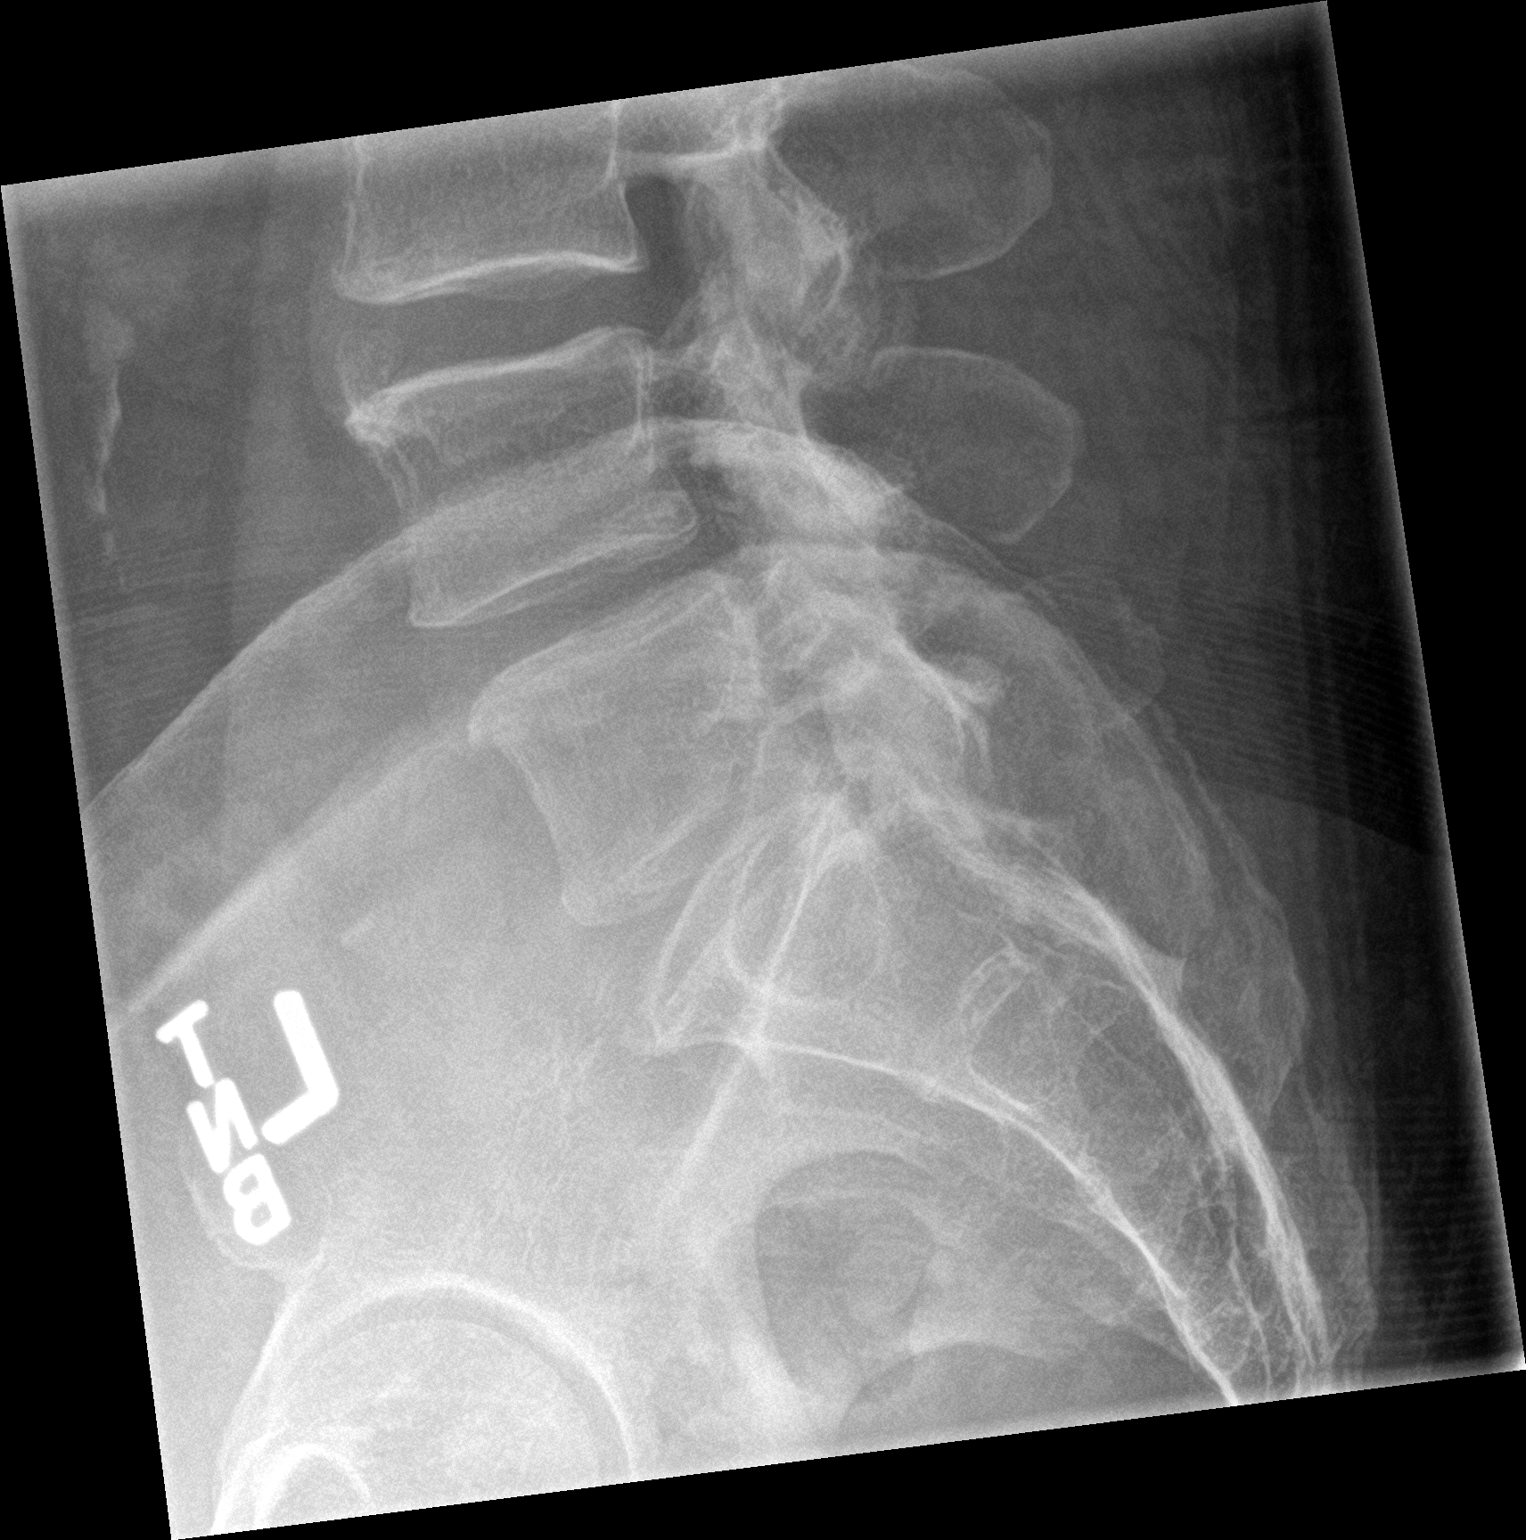

[3 of 3 positions shown; findings below may reference images not displayed]

FINDINGS: Five non-rib-bearing lumbar vertebra.

Bones appear mildly demineralized.

Facet degenerative changes and mild disc space narrowing at L4-L5.

Small superior endplate spurs at L3 and L4.

Vertebral body heights maintained without fracture or subluxation.

No bone destruction or gross spondylolysis.

Mild atherosclerotic calcification aorta.
IMPRESSION: Mild degenerative changes of the caudal lumbar spine.

No acute abnormalities.
